# Patient Record
Sex: Male | Born: 1985 | Race: White | Hispanic: No | Marital: Married | Smoking: Never smoker
Health system: Southern US, Community
[De-identification: ages and names within clinical notes are randomized; demographics above are authoritative.]

## PROBLEM LIST (undated history)

## (undated) DIAGNOSIS — F329 Major depressive disorder, single episode, unspecified: Secondary | ICD-10-CM

## (undated) DIAGNOSIS — F419 Anxiety disorder, unspecified: Secondary | ICD-10-CM

## (undated) DIAGNOSIS — F32A Depression, unspecified: Secondary | ICD-10-CM

---
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
## 2020-03-02 ENCOUNTER — Ambulatory Visit
Admission: EM | Admit: 2020-03-02 | Discharge: 2020-03-02 | Disposition: A | Discharge status: Home / Self Care | Payer: BC Managed Care – PPO | Attending: Emergency Medicine | Admitting: Emergency Medicine

## 2020-03-02 ENCOUNTER — Other Ambulatory Visit: Payer: Self-pay

## 2020-03-02 DIAGNOSIS — S6992XA Unspecified injury of left wrist, hand and finger(s), initial encounter: Secondary | ICD-10-CM | POA: Diagnosis not present

## 2020-03-02 DIAGNOSIS — M79645 Pain in left finger(s): Secondary | ICD-10-CM

## 2020-03-02 HISTORY — DX: Depression, unspecified: F32.A

## 2020-03-02 HISTORY — DX: Anxiety disorder, unspecified: F41.9

## 2020-03-02 MED ORDER — NAPROXEN 500 MG PO TABS
500.0000 mg | ORAL_TABLET | Freq: Two times a day (BID) | ORAL | 0 refills | Status: AC
Start: 2020-03-02 — End: ?

## 2020-03-02 NOTE — Discharge Instructions (Signed)
Patient will return tomorrow for x-ray Continue conservative management of rest, ice, and elevation Finger splint applied Take naproxen as needed for pain relief (may cause abdominal discomfort, ulcers, and GI bleeds avoid taking with other NSAIDs) Return or go to the ER if you have any new or worsening symptoms (fever, chills, chest pain, redness, swelling, bruising, deformity, etc...)

## 2020-03-02 NOTE — ED Provider Notes (Addendum)
[facility]   [id number] 03/02/20 Arrival Time: 1813  CC: Finger PAIN  SUBJECTIVE: History from: patient. Keith Tyler is a 34 y.o. male complains of LT middle finger pain and injury that occurred today.  Playing catch, and finger was jammed by football.  Localizes the pain to the LT middle finger.  Describes the pain as constant and achy in character.  Has NOT tried OTC medications.  Symptoms are made worse with movement.  Complains of associated swelling and bruising, numbness.  Denies fever, chills, erythema.    ROS: As per HPI.  All other pertinent ROS negative.     Past Medical History:  Diagnosis Date  . Anxiety   . Depression    History reviewed. No pertinent surgical history. No Known Allergies No current facility-administered medications on file prior to encounter.   Current Outpatient Medications on File Prior to Encounter  Medication Sig Dispense Refill  . FLUoxetine (PROZAC) 40 MG capsule TAKE 1 CAPSULE(40 MG) BY MOUTH DAILY     Social History   Socioeconomic History  . Marital status: Married    Spouse name: Not on file  . Number of children: Not on file  . Years of education: Not on file  . Highest education level: Not on file  Occupational History  . Not on file  Tobacco Use  . Smoking status: Never Smoker  . Smokeless tobacco: Never Used  Substance and Sexual Activity  . Alcohol use: Yes  . Drug use: Never  . Sexual activity: Not on file  Other Topics Concern  . Not on file  Social History Narrative  . Not on file   Social Determinants of Health   Financial Resource Strain:   . Difficulty of Paying Living Expenses:   Food Insecurity:   . Worried About Programme researcher, broadcasting/film/video in the Last Year:   . Barista in the Last Year:   Transportation Needs:   . Freight forwarder (Medical):   Marland Kitchen Lack of Transportation (Non-Medical):   Physical Activity:   . Days of Exercise per Week:   . Minutes of Exercise per Session:   Stress:   .  Feeling of Stress :   Social Connections:   . Frequency of Communication with Friends and Family:   . Frequency of Social Gatherings with Friends and Family:   . Attends Religious Services:   . Active Member of Clubs or Organizations:   . Attends Banker Meetings:   Marland Kitchen Marital Status:   Intimate Partner Violence:   . Fear of Current or Ex-Partner:   . Emotionally Abused:   Marland Kitchen Physically Abused:   . Sexually Abused:    History reviewed. No pertinent family history.  OBJECTIVE:  Vitals:   03/02/20 1833  BP: 135/88  Pulse: 77  Resp: 19  Temp: 98.6 F (37 C)  TempSrc: Oral  SpO2: 98%    General appearance: ALERT; in no acute distress.  Head: NCAT Lungs: Normal respiratory effort CV: Radial pulse 2+; cap refill < 2 seconds Musculoskeletal: LT hand Inspection: swelling and ecchymosis base of third digit Palpation: TTP over base of third digit, proximal phalanx, and PIP joint ROM: LROM Strength: deferred Skin: warm and dry Neurologic: Ambulates without difficulty; Sensation intact about the upper extremities Psychological: alert and cooperative; normal mood and affect   ASSESSMENT & PLAN:  1. Finger injury, left, initial encounter   2. Finger pain, left    Meds ordered this encounter  Medications  .  naproxen (NAPROSYN) 500 MG tablet    Sig: Take 1 tablet (500 mg total) by mouth 2 (two) times daily.    Dispense:  30 tablet    Refill:  0    Order Specific Question:   Supervising Provider    Answer:   Eustace Moore [[id number]]   Patient will return tomorrow for x-ray Continue conservative management of rest, ice, and elevation Finger splint applied Take naproxen as needed for pain relief (may cause abdominal discomfort, ulcers, and GI bleeds avoid taking with other NSAIDs) Return or go to the ER if you have any new or worsening symptoms (fever, chills, chest pain, redness, swelling, bruising, deformity, etc...)   Reviewed expectations re: course of  current medical issues. Questions answered. Outlined signs and symptoms indicating need for more acute intervention. Patient verbalized understanding. After Visit Summary given.    Rennis Harding, Cordelia Poche 03/02/20 1838   ADDENDUM:  Patient returned on 03/03/20 for x-ray of RT hand.    DG Hand Complete Left  Result Date: 03/03/2020 CLINICAL DATA:  Left hand injury. Injured at the third digit pop playing football. EXAM: LEFT HAND - COMPLETE 3+ VIEW COMPARISON:  None. FINDINGS: There is soft tissue swelling about the third digit. There is no definite acute displaced fracture or dislocation, however evaluation was limited by a suboptimal lateral view. There is soft tissue swelling about the dorsal aspect of the hand at the level of the metacarpal heads. IMPRESSION: Soft tissue swelling about the third digit. No definite acute displaced fracture or dislocation, however evaluation was limited by a suboptimal lateral view. Electronically Signed   By: Katherine Mantle M.D.   On: 03/03/2020 17:49     X-rays negative for bony abnormalities including fracture, or dislocation.    I have reviewed the x-rays myself and the radiologist interpretation. I am in agreement with the radiologist interpretation.       Rennis Harding, PA-C 03/03/20 1756

## 2020-03-02 NOTE — ED Triage Notes (Signed)
Pt presents with pain and swelling in the left middle finger today. States he was catching a football ball today.

## 2020-03-03 ENCOUNTER — Other Ambulatory Visit: Payer: Self-pay

## 2020-03-03 ENCOUNTER — Ambulatory Visit (INDEPENDENT_AMBULATORY_CARE_PROVIDER_SITE_OTHER): Payer: BC Managed Care – PPO

## 2020-03-03 ENCOUNTER — Ambulatory Visit
Admission: EM | Admit: 2020-03-03 | Discharge: 2020-03-03 | Disposition: A | Discharge status: Home / Self Care | Payer: BC Managed Care – PPO | Attending: Emergency Medicine | Admitting: Emergency Medicine

## 2020-03-03 DIAGNOSIS — S6992XA Unspecified injury of left wrist, hand and finger(s), initial encounter: Secondary | ICD-10-CM

## 2020-03-03 NOTE — ED Triage Notes (Signed)
Pt return for x ray

## 2022-03-23 IMAGING — DX DG HAND COMPLETE 3+V*L*
3 series · 3 of 3 positions shown · non-contrast
Comparison: None.

CLINICAL DATA: Left hand injury. Injured at the third digit pop
playing football.

EXAM:
LEFT HAND - COMPLETE 3+ VIEW

[hand pa]
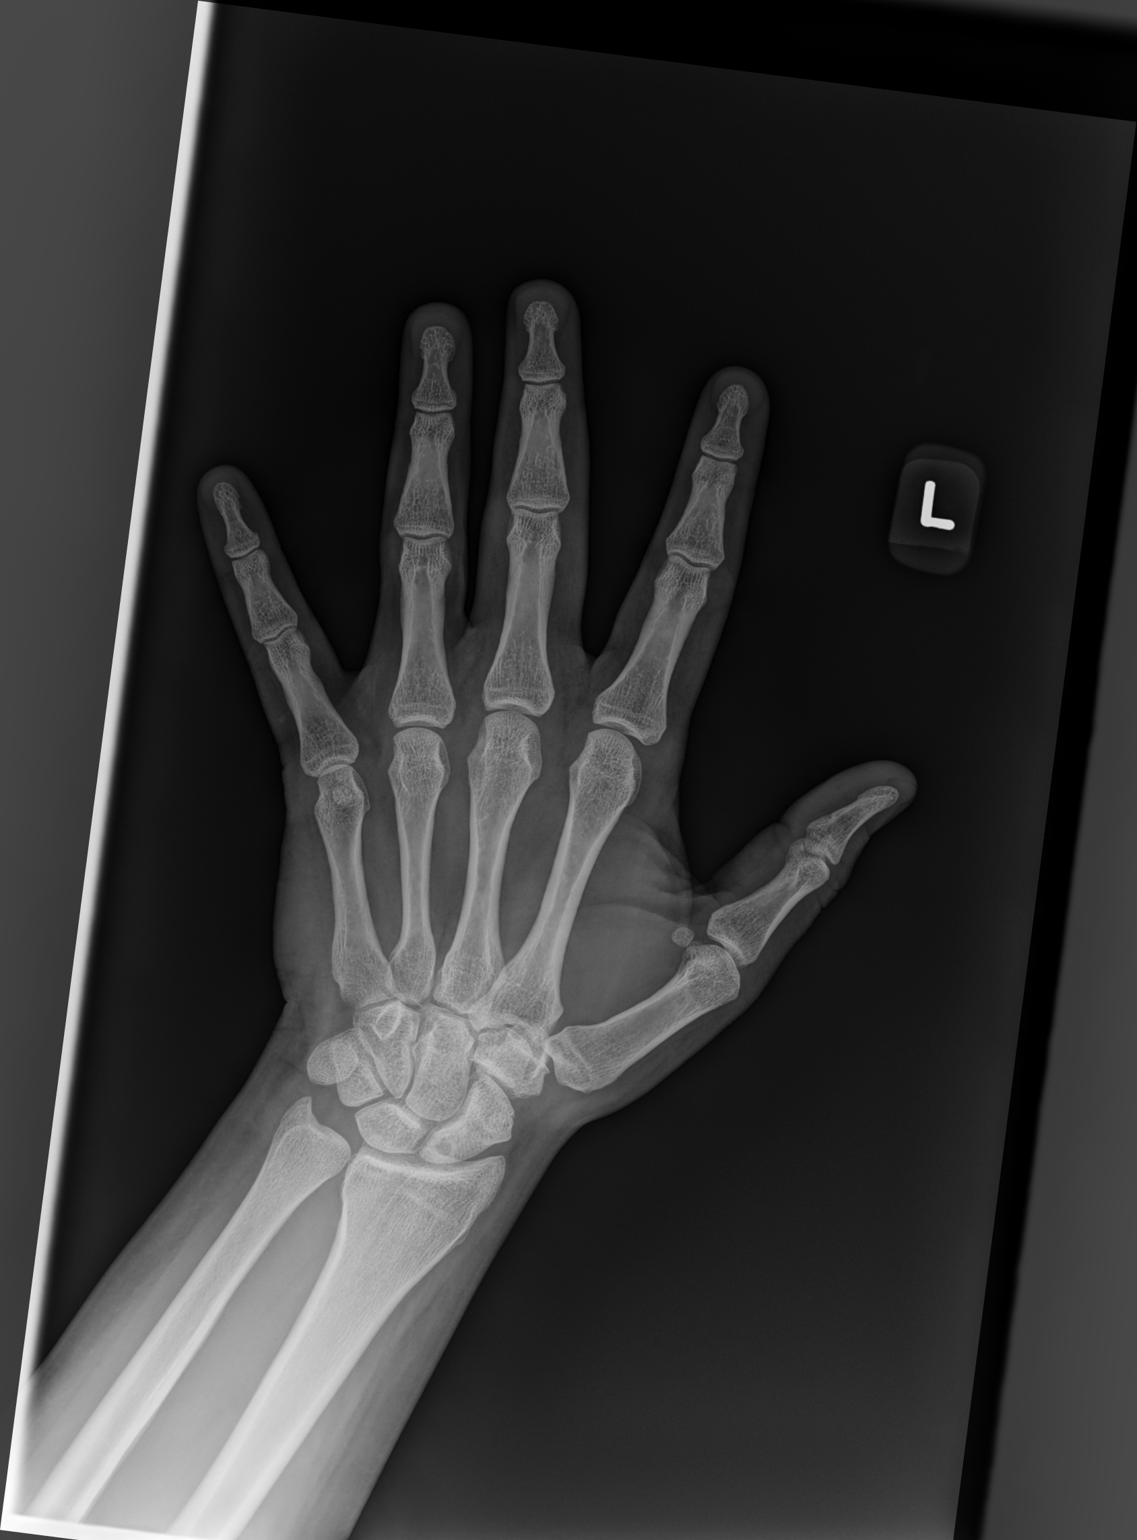

[hand mlo]
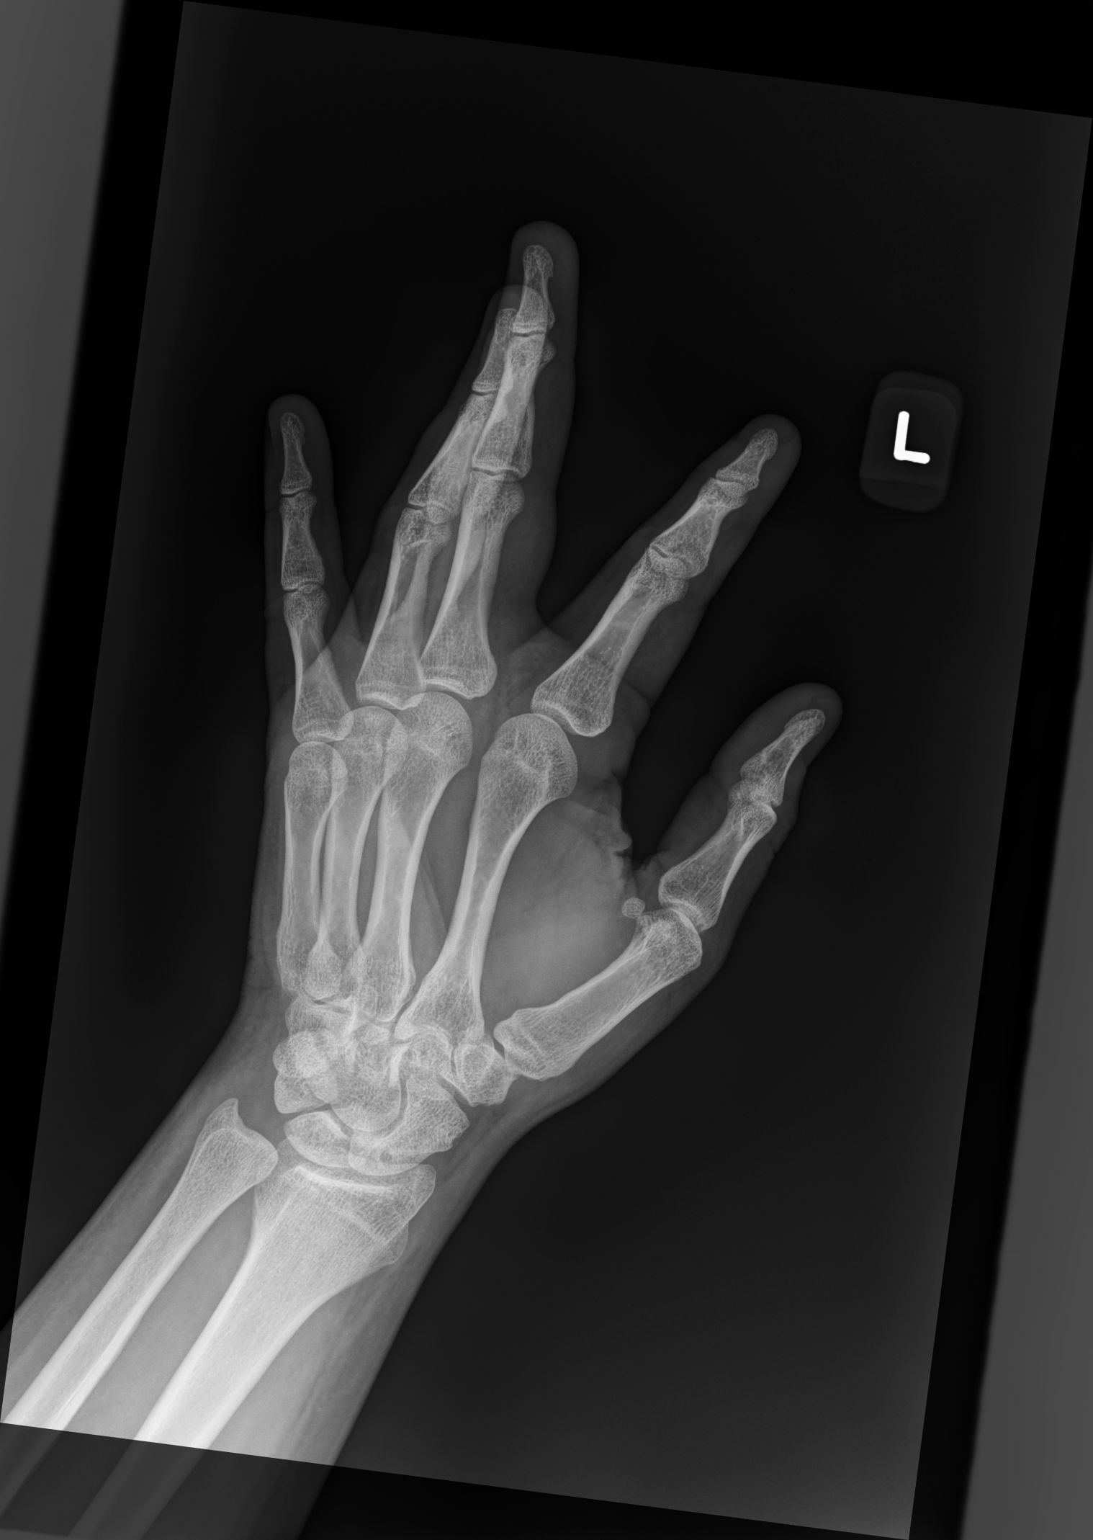

[hand lat]
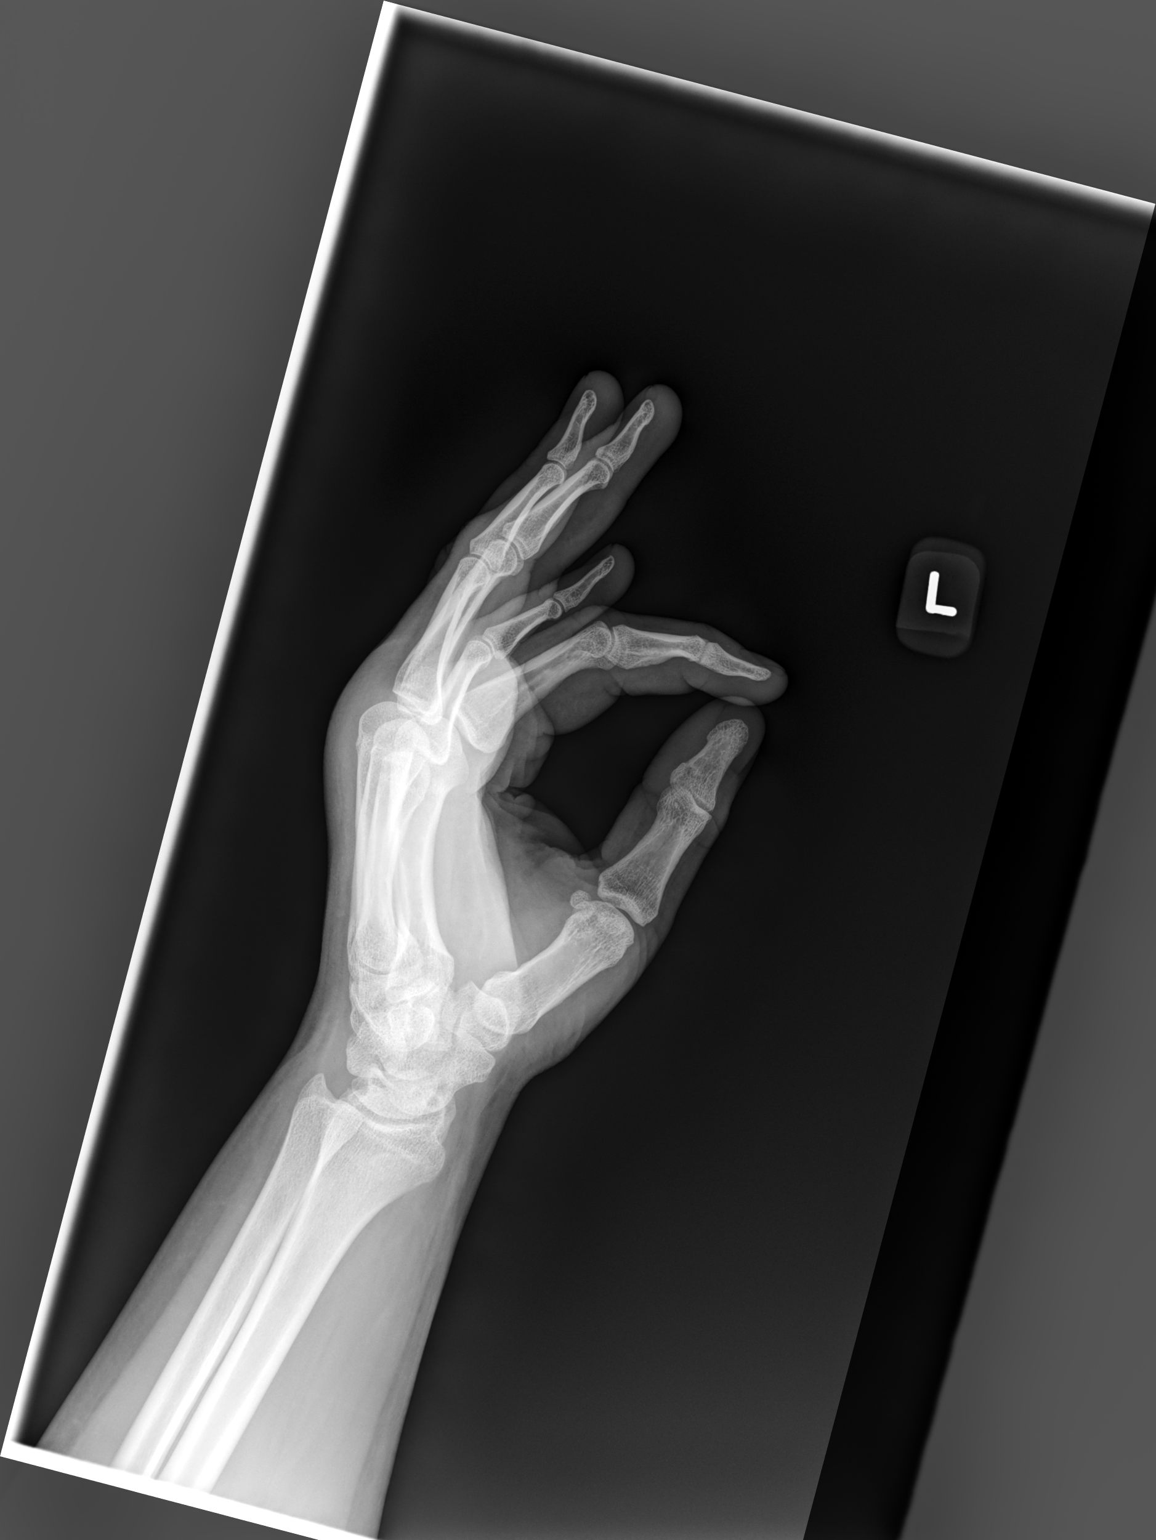

[3 of 3 positions shown; findings below may reference images not displayed]

FINDINGS: There is soft tissue swelling about the third digit. There is no
definite acute displaced fracture or dislocation, however evaluation
was limited by a suboptimal lateral view. There is soft tissue
swelling about the dorsal aspect of the hand at the level of the
metacarpal heads.
IMPRESSION: Soft tissue swelling about the third digit. No definite acute
displaced fracture or dislocation, however evaluation was limited by
a suboptimal lateral view.
